# Patient Record
Sex: Male | Born: 2000 | Race: Black or African American | Hispanic: No | Marital: Single
Health system: Southern US, Community
[De-identification: ages and names within clinical notes are randomized; demographics above are authoritative.]

## PROBLEM LIST (undated history)

## (undated) HISTORY — PX: HERNIA REPAIR: SHX51

---
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
## 2018-04-14 ENCOUNTER — Emergency Department: Payer: 59

## 2018-04-14 ENCOUNTER — Emergency Department
Admission: EM | Admit: 2018-04-14 | Discharge: 2018-04-14 | Disposition: A | Discharge status: Home / Self Care | Payer: 59 | Attending: Emergency Medicine | Admitting: Emergency Medicine

## 2018-04-14 ENCOUNTER — Other Ambulatory Visit: Payer: Self-pay

## 2018-04-14 DIAGNOSIS — Z7722 Contact with and (suspected) exposure to environmental tobacco smoke (acute) (chronic): Secondary | ICD-10-CM | POA: Diagnosis not present

## 2018-04-14 DIAGNOSIS — R109 Unspecified abdominal pain: Secondary | ICD-10-CM | POA: Diagnosis not present

## 2018-04-14 DIAGNOSIS — R0789 Other chest pain: Secondary | ICD-10-CM

## 2018-04-14 DIAGNOSIS — R0602 Shortness of breath: Secondary | ICD-10-CM | POA: Diagnosis not present

## 2018-04-14 LAB — CBC
HCT: 42.4 % (ref 40.0–52.0)
HEMOGLOBIN: 14.5 g/dL (ref 13.0–18.0)
MCH: 27.7 pg (ref 26.0–34.0)
MCHC: 34.1 g/dL (ref 32.0–36.0)
MCV: 81 fL (ref 80.0–100.0)
PLATELETS: 234 10*3/uL (ref 150–440)
RBC: 5.23 MIL/uL (ref 4.40–5.90)
RDW: 13.3 % (ref 11.5–14.5)
WBC: 6.2 10*3/uL (ref 3.8–10.6)

## 2018-04-14 LAB — BASIC METABOLIC PANEL
ANION GAP: 9 (ref 5–15)
BUN: 17 mg/dL (ref 6–20)
CHLORIDE: 103 mmol/L (ref 98–111)
CO2: 28 mmol/L (ref 22–32)
Calcium: 10.1 mg/dL (ref 8.9–10.3)
Creatinine, Ser: 1.29 mg/dL — ABNORMAL HIGH (ref 0.61–1.24)
GFR calc non Af Amer: >60 mL/min (ref >60)
Glucose, Bld: 85 mg/dL (ref 70–99)
POTASSIUM: 4.2 mmol/L (ref 3.5–5.1)
SODIUM: 140 mmol/L (ref 135–145)

## 2018-04-14 LAB — TROPONIN I

## 2018-04-14 MED ORDER — FAMOTIDINE 20 MG PO TABS: 20.0000 mg | ORAL_TABLET | Freq: Two times a day (BID) | ORAL | 0 refills | Status: DC

## 2018-04-14 MED ORDER — ALUM & MAG HYDROXIDE-SIMETH 200-200-20 MG/5ML PO SUSP
15.0000 mL | Freq: Once | ORAL | Status: AC
  Administered 2018-04-14: 15 mL via ORAL
  Filled 2018-04-14: qty 30

## 2018-04-14 MED ORDER — FAMOTIDINE 20 MG PO TABS
20.0000 mg | ORAL_TABLET | Freq: Once | ORAL | Status: AC
  Administered 2018-04-14: 20 mg via ORAL
  Filled 2018-04-14: qty 1

## 2018-04-14 NOTE — ED Notes (Signed)
Pt states that he has been having some chest pain and abdominal pain for the last hour. Pt states that it feels more like burning. Pt In no distress at this time.

## 2018-04-14 NOTE — Discharge Instructions (Signed)
Take the Pepcid as prescribed over the next couple of weeks.  Return to the ER for new, worsening, persistent severe chest or abdominal pain, shortness of breath, lightheadedness, fever, vomiting, or any other new or worsening symptoms that concern you.

## 2018-04-14 NOTE — ED Provider Notes (Signed)
[facility] Emergency Department Provider Note ____________________________________________   First MD Initiated Contact with Patient 04/14/18 [phone]     (approximate)  I have reviewed the triage vital signs and the nursing notes.   HISTORY  Chief Complaint Chest Pain    HPI Brandon Armstrong is a 17 y.o. male with PMH of GERD who presents with chest pain, acute onset around 3 AM, described as sharp, and occurring mainly in the left side of his chest.  He states it feels like a bubble that is expanding and contracting.  He denies any radiation to me.  He reports mild shortness of breath but denies nausea, lightheadedness, cough, or fever.  No prior history of this pain, although he states that he does have GERD and it has flared up recently.  He denies any leg swelling.  History reviewed. No pertinent past medical history.  There are no active problems to display for this patient.   Past Surgical History:  Procedure Laterality Date  . HERNIA REPAIR      Prior to Admission medications   Medication Sig Start Date End Date Taking? Authorizing Provider  famotidine (PEPCID) 20 MG tablet Take 1 tablet (20 mg total) by mouth 2 (two) times daily for 15 days. 04/14/18 04/29/18  Dionne Bucy, MD    Allergies Patient has no known allergies.  No family history on file.  Social History Social History   Tobacco Use  . Smoking status: Passive Smoke Exposure - Never Smoker  . Smokeless tobacco: Never Used  Substance Use Topics  . Alcohol use: No  . Drug use: No    Review of Systems  Constitutional: No fever. Eyes: No redness. ENT: No neck pain. Cardiovascular: Positive for chest pain. Respiratory: Positive for mild shortness of breath. Gastrointestinal: No vomiting.  Genitourinary: Negative for flank pain.  Musculoskeletal: Negative for back pain. Skin: Negative for rash. Neurological: Negative for  headache.   ____________________________________________   PHYSICAL EXAM:  VITAL SIGNS: ED Triage Vitals  Enc Vitals Group     BP 04/14/18 0337 117/67     Pulse Rate 04/14/18 0337 (!) 58     Resp 04/14/18 0337 17     Temp 04/14/18 0337 98 F (36.7 C)     Temp Source 04/14/18 0337 Oral     SpO2 04/14/18 0337 100 %     Weight 04/14/18 0335 150 lb (68 kg)     Height 04/14/18 0335 6\' 1"  (1.854 m)     Head Circumference --      Peak Flow --      Pain Score 04/14/18 0335 9     Pain Loc --      Pain Edu? --      Excl. in [place]? --     Constitutional: Alert and oriented. Well appearing and in no acute distress. Eyes: Conjunctivae are normal.  Head: Atraumatic. Nose: No congestion/rhinnorhea. Mouth/Throat: Mucous membranes are moist.   Neck: Normal range of motion.  Cardiovascular: Normal rate, regular rhythm. Grossly normal heart sounds.  Good peripheral circulation. Respiratory: Normal respiratory effort.  No retractions. Lungs CTAB. Gastrointestinal: Soft and nontender. No distention.  Genitourinary: No flank tenderness. Musculoskeletal: No lower extremity edema.  Extremities warm and well perfused.  No calf or popliteal swelling or tenderness. Neurologic:  Normal speech and language. No gross focal neurologic deficits are appreciated.  Skin:  Skin is warm and dry. No rash noted. Psychiatric: Mood and affect are normal. Speech and behavior are normal.  ____________________________________________   LABS (all labs ordered are listed, but only abnormal results are displayed)  Labs Reviewed  BASIC METABOLIC PANEL - Abnormal; Notable for the following components:      Result Value   Creatinine, Ser 1.29 (*)    All other components within normal limits  CBC  TROPONIN I   ____________________________________________  EKG  ED ECG REPORT I, Dionne Bucy, the attending physician, personally viewed and interpreted this ECG.  Date: 04/14/2018 EKG Time: 0336 Rate:  72 Rhythm: normal sinus rhythm QRS Axis: Right axis Intervals: Incomplete RBBB ST/T Wave abnormalities: normal Narrative Interpretation: no evidence of acute ischemia  ____________________________________________  RADIOLOGY  CXR no focal infiltrate or other acute abnormalities  ____________________________________________   PROCEDURES  Procedure(s) performed: No  Procedures  Critical Care performed: No ____________________________________________   INITIAL IMPRESSION / ASSESSMENT AND PLAN / ED COURSE  Pertinent labs & imaging results that were available during my care of the patient were reviewed by me and considered in my medical decision making (see chart for details).  17 year old male with PMH as noted above presents with sharp, atypical left-sided chest pain which awoke him from sleep and is now subsiding.  He reports mild associated shortness of breath and states that the pain is somewhat migratory positional.  He has a history of GERD and although he states that he has not had this pain before he does report that his GERD has been flaring up recently.  He is not on any medication for regularly.  On exam, the patient is very well-appearing and comfortable.  His vital signs are normal.  The remainder of his exam is unremarkable.  His EKG shows incomplete RBBB but is nonischemic.  Overall the presentation is consistent with benign etiology.  I suspect most likely GERD, gastritis, or gas pain.  Since there is somewhat migratory and positional differential also includes musculoskeletal pain.  ----------------------------------------- 6:20 AM on 04/14/2018 -----------------------------------------  The pain has significantly improved after the Pepcid and Maalox.  The patient feels well to go home.  Given his young age normal EKG, and the overall extremely low risk for ACS, there is no indication for repeat troponin.  The fact that the pain improved with the Pepcid and Maalox  is highly suggestive of GERD as the cause.  I will prescribe a two-week course of Pepcid.  Return precautions given, and the patient expresses understanding. ____________________________________________   FINAL CLINICAL IMPRESSION(S) / ED DIAGNOSES  Final diagnoses:  Atypical chest pain      NEW MEDICATIONS STARTED DURING THIS VISIT:  New Prescriptions   FAMOTIDINE (PEPCID) 20 MG TABLET    Take 1 tablet (20 mg total) by mouth 2 (two) times daily for 15 days.     Note:  This document was prepared using Dragon voice recognition software and may include unintentional dictation errors.    Dionne Bucy, MD 04/14/18 [phone]

## 2018-04-14 NOTE — ED Triage Notes (Signed)
Pt arrives to ED via POV from home with c/o left-sided chest pain with abdominal radiation x30 mins. (+) SHOB, but denies N/V/D, no fever. Pt reports pain as "sharp and pressure", no cardiac h/x.

## 2019-09-30 ENCOUNTER — Other Ambulatory Visit: Payer: Self-pay

## 2019-09-30 ENCOUNTER — Emergency Department
Admission: EM | Admit: 2019-09-30 | Discharge: 2019-09-30 | Disposition: A | Discharge status: Home / Self Care | Payer: 59 | Attending: Emergency Medicine | Admitting: Emergency Medicine

## 2019-09-30 ENCOUNTER — Encounter: Payer: Self-pay | Admitting: Emergency Medicine

## 2019-09-30 DIAGNOSIS — R251 Tremor, unspecified: Secondary | ICD-10-CM | POA: Insufficient documentation

## 2019-09-30 DIAGNOSIS — R110 Nausea: Secondary | ICD-10-CM | POA: Insufficient documentation

## 2019-09-30 DIAGNOSIS — Z7722 Contact with and (suspected) exposure to environmental tobacco smoke (acute) (chronic): Secondary | ICD-10-CM | POA: Insufficient documentation

## 2019-09-30 DIAGNOSIS — R197 Diarrhea, unspecified: Secondary | ICD-10-CM | POA: Insufficient documentation

## 2019-09-30 DIAGNOSIS — R252 Cramp and spasm: Secondary | ICD-10-CM | POA: Insufficient documentation

## 2019-09-30 MED ORDER — ONDANSETRON 4 MG PO TBDP
4.0000 mg | ORAL_TABLET | Freq: Once | ORAL | Status: AC
  Administered 2019-09-30: 4 mg via ORAL
  Filled 2019-09-30: qty 1

## 2019-09-30 MED ORDER — DICYCLOMINE HCL 10 MG PO CAPS
10.0000 mg | ORAL_CAPSULE | Freq: Once | ORAL | Status: AC
  Administered 2019-09-30: 10 mg via ORAL
  Filled 2019-09-30: qty 1

## 2019-09-30 MED ORDER — ONDANSETRON 4 MG PO TBDP: 4.0000 mg | ORAL_TABLET | Freq: Three times a day (TID) | ORAL | 0 refills | Status: AC | PRN

## 2019-09-30 MED ORDER — DICYCLOMINE HCL 10 MG PO CAPS: 10.0000 mg | ORAL_CAPSULE | Freq: Four times a day (QID) | ORAL | 0 refills | Status: AC

## 2019-09-30 NOTE — ED Triage Notes (Signed)
Pt presents via pov from home with nausea x 3 weeks. States he ate at [employer] and has felt nauseous ever since. He reports that he has had some diarrhea but no actual emesis. Pt also states that he smoked marijuana daily until a week ago, when he decided to quit; nausea continues. Pt alert & oriented, nad noted.

## 2019-09-30 NOTE — ED Notes (Signed)
See triage note  Presents with complaints of stomach cramping after eating food  States he is able to drink fluids   Denies any vomiting

## 2019-09-30 NOTE — ED Provider Notes (Signed)
Emergency Department Provider Note  ____________________________________________  Time seen: Approximately 9:30 PM  I have reviewed the triage vital signs and the nursing notes.   HISTORY  Chief Complaint Nausea   Historian Patient   HPI Brandon Armstrong is a 19 y.o. male presents to the emergency department with nausea abdominal spasms since stopping marijuana approximately 4 days ago.  Patient states that he is also had some diarrhea.  He states that his hands have been shaking since stopping marijuana.  No chest pain, chest tightness or abdominal pain.  He denies fever or chills at home.  No associated rhinorrhea, nasal congestion or nonproductive cough.  No other alleviating measures have been attempted.   History reviewed. No pertinent past medical history.   Immunizations up to date:  Yes.     History reviewed. No pertinent past medical history.  There are no problems to display for this patient.   Past Surgical History:  Procedure Laterality Date  . HERNIA REPAIR      Prior to Admission medications   Medication Sig Start Date End Date Taking? Authorizing Provider  dicyclomine (BENTYL) 10 MG capsule Take 1 capsule (10 mg total) by mouth 4 (four) times daily for 14 days. 09/30/19 10/14/19  Orvil Feil, PA-C  ondansetron (ZOFRAN ODT) 4 MG disintegrating tablet Take 1 tablet (4 mg total) by mouth every 8 (eight) hours as needed for up to 5 days for nausea or vomiting. 09/30/19 10/05/19  Orvil Feil, PA-C    Allergies Patient has no known allergies.  History reviewed. No pertinent family history.  Social History Social History   Tobacco Use  . Smoking status: Passive Smoke Exposure - Never Smoker  . Smokeless tobacco: Never Used  Substance Use Topics  . Alcohol use: No  . Drug use: No     Review of Systems  Constitutional: No fever/chills Eyes:  No discharge ENT: No upper respiratory complaints. Respiratory: no cough. No SOB/ use of accessory  muscles to breath Gastrointestinal: Patient has nausea. No diarrhea.  No constipation. Musculoskeletal: Negative for musculoskeletal pain. Skin: Negative for rash, abrasions, lacerations, ecchymosis.    ____________________________________________   PHYSICAL EXAM:  VITAL SIGNS: ED Triage Vitals  Enc Vitals Group     BP 09/30/19 1601 134/82     Pulse Rate 09/30/19 1601 (!) 123     Resp 09/30/19 1601 20     Temp 09/30/19 1601 98.2 F (36.8 C)     Temp Source 09/30/19 1601 Oral     SpO2 09/30/19 1601 100 %     Weight 09/30/19 1602 155 lb (70.3 kg)     Height 09/30/19 1602 6\' 1"  (1.854 m)     Head Circumference --      Peak Flow --      Pain Score 09/30/19 1601 0     Pain Loc --      Pain Edu? --      Excl. in GC? --      Constitutional: Alert and oriented. Well appearing and in no acute distress. Eyes: Conjunctivae are normal. PERRL. EOMI. Head: Atraumatic. Cardiovascular: Normal rate, regular rhythm. Normal S1 and S2.  Good peripheral circulation. Respiratory: Normal respiratory effort without tachypnea or retractions. Lungs CTAB. Good air entry to the bases with no decreased or absent breath sounds Gastrointestinal: Bowel sounds x 4 quadrants. Soft and nontender to palpation. No guarding or rigidity. No distention. Musculoskeletal: Full range of motion to all extremities. No obvious deformities noted Neurologic:  Normal for  age. No gross focal neurologic deficits are appreciated.  Skin:  Skin is warm, dry and intact. No rash noted. Psychiatric: Mood and affect are normal for age. Speech and behavior are normal.   ____________________________________________   LABS (all labs ordered are listed, but only abnormal results are displayed)  Labs Reviewed - No data to display ____________________________________________  EKG   ____________________________________________  RADIOLOGY   No results  found.  ____________________________________________    PROCEDURES  Procedure(s) performed:     Procedures     Medications  ondansetron (ZOFRAN-ODT) disintegrating tablet 4 mg (4 mg Oral Given 09/30/19 1657)  dicyclomine (BENTYL) capsule 10 mg (10 mg Oral Given 09/30/19 1657)     ____________________________________________   INITIAL IMPRESSION / ASSESSMENT AND PLAN / ED COURSE  Pertinent labs & imaging results that were available during my care of the patient were reviewed by me and considered in my medical decision making (see chart for details).    Assessment and plan Nausea 19 year old male presents to the emergency department with shaking hands, nausea and a sensation of abdominal spasms after stopping marijuana for the past 4 days.  Vital signs were reassuring at triage.  On physical exam, patient was able to provide assisting to history.  His abdomen was soft and nontender without guarding.  Patient was given Zofran and Bentyl in the emergency department he reported that his symptoms improved significantly.  He was discharged with Zofran and Bentyl.  Return precautions were given to return with new or worsening symptoms.  All patient questions were answered.   ____________________________________________  FINAL CLINICAL IMPRESSION(S) / ED DIAGNOSES  Final diagnoses:  Nausea      NEW MEDICATIONS STARTED DURING THIS VISIT:  ED Discharge Orders         Ordered    ondansetron (ZOFRAN ODT) 4 MG disintegrating tablet  Every 8 hours PRN     09/30/19 1839    dicyclomine (BENTYL) 10 MG capsule  4 times daily     09/30/19 1839              This chart was dictated using voice recognition software/Dragon. Despite best efforts to proofread, errors can occur which can change the meaning. Any change was purely unintentional.     Lannie Fields, PA-C 09/30/19 2134    Arta Silence, MD 09/30/19 2324

## 2019-09-30 NOTE — ED Triage Notes (Signed)
First RN Note: Pt presents to ED via POV with c/o nausea. Pt denies vomiting, states he just feels like he is going to throw up.

## 2019-11-18 IMAGING — CR DG CHEST 2V
3 series · 3 of 3 positions shown · non-contrast
Comparison: Chest radiograph February 09, 2013

CLINICAL DATA: LEFT chest pain radiating to the abdomen for 30
minutes. Shortness of breath.

EXAM:
CHEST - 2 VIEW

[chest pa]
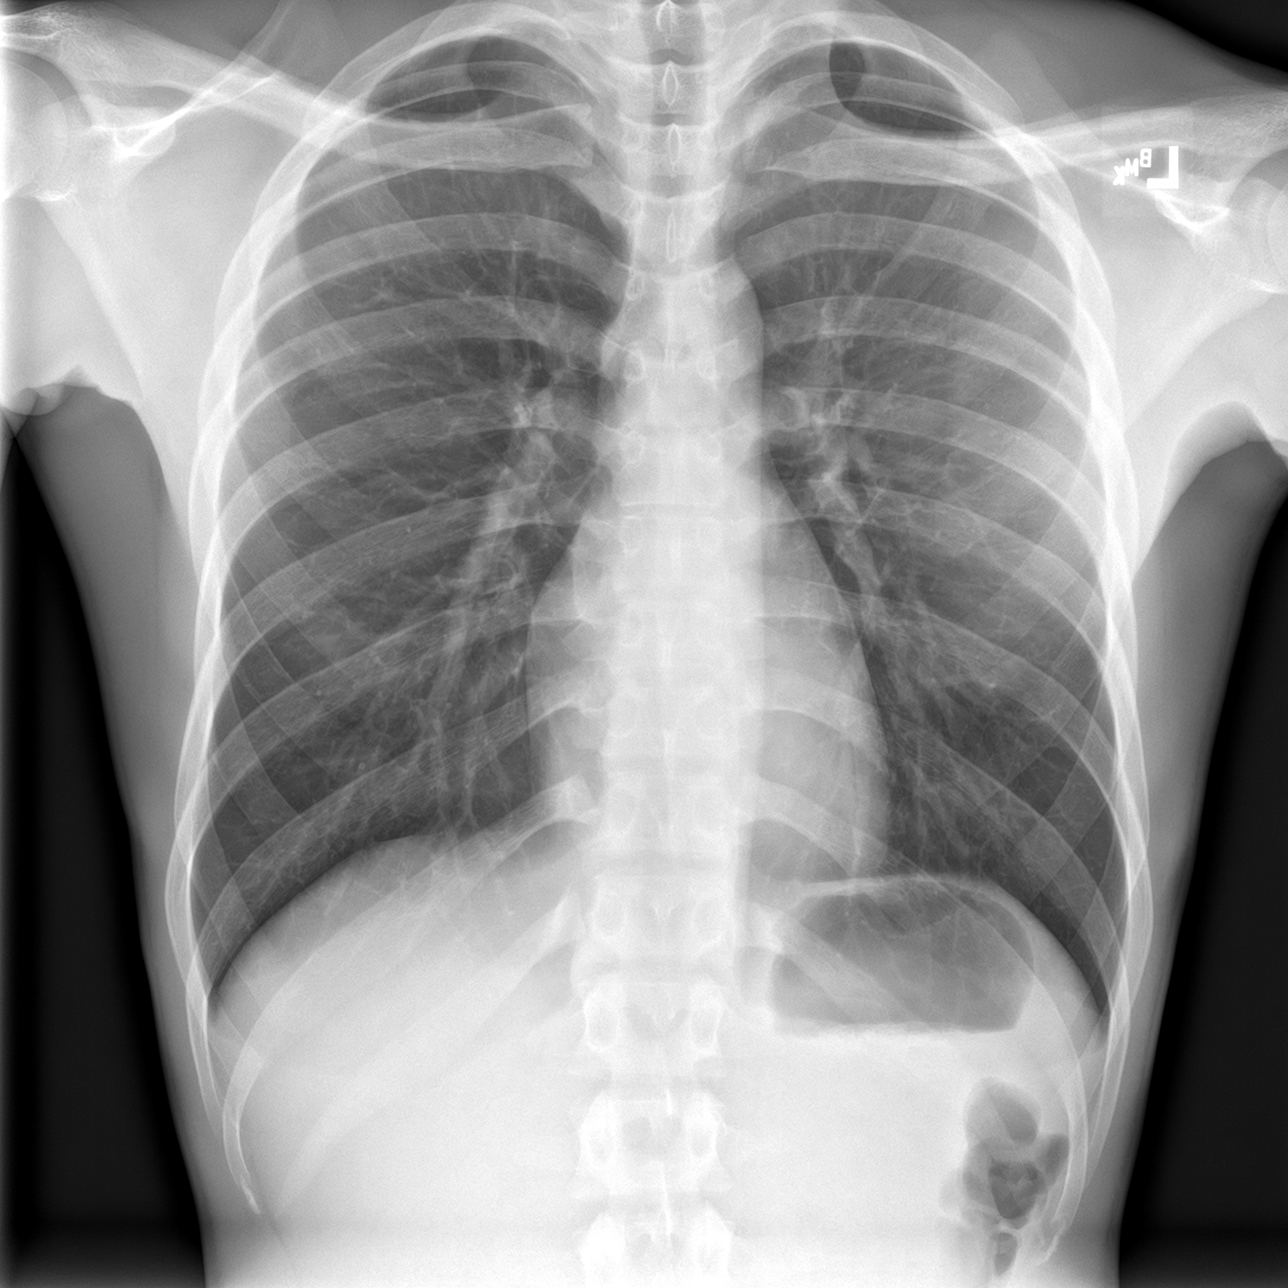

[chest lat (1 of 2)]
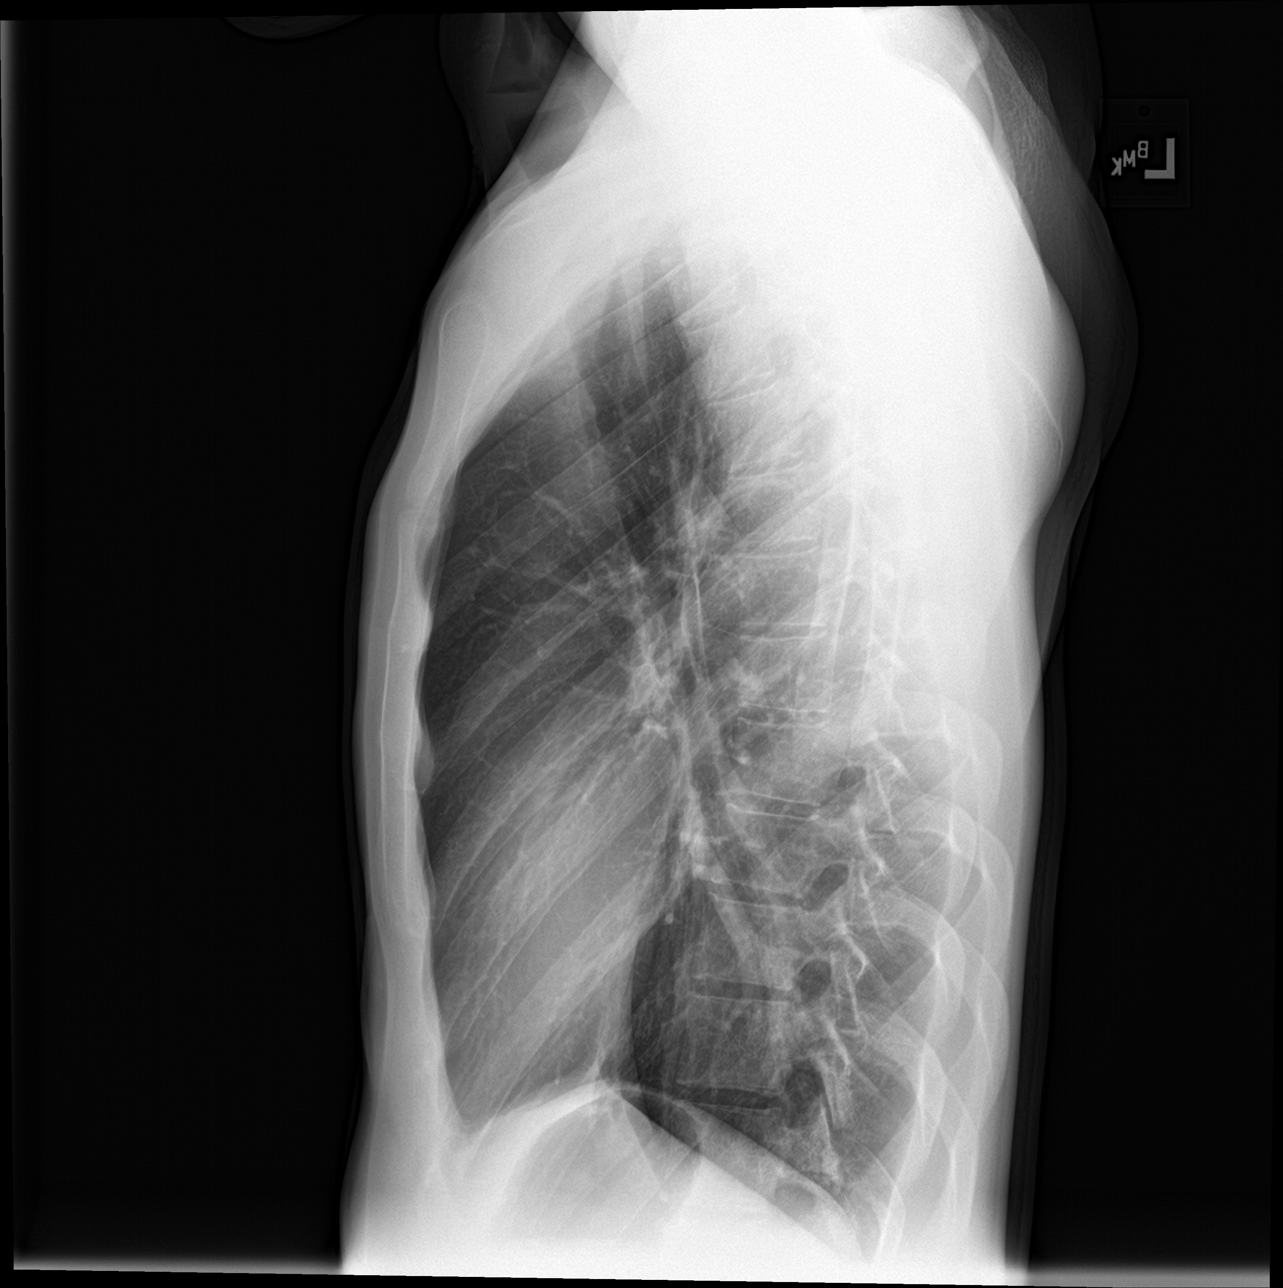

[chest lat (2 of 2)]
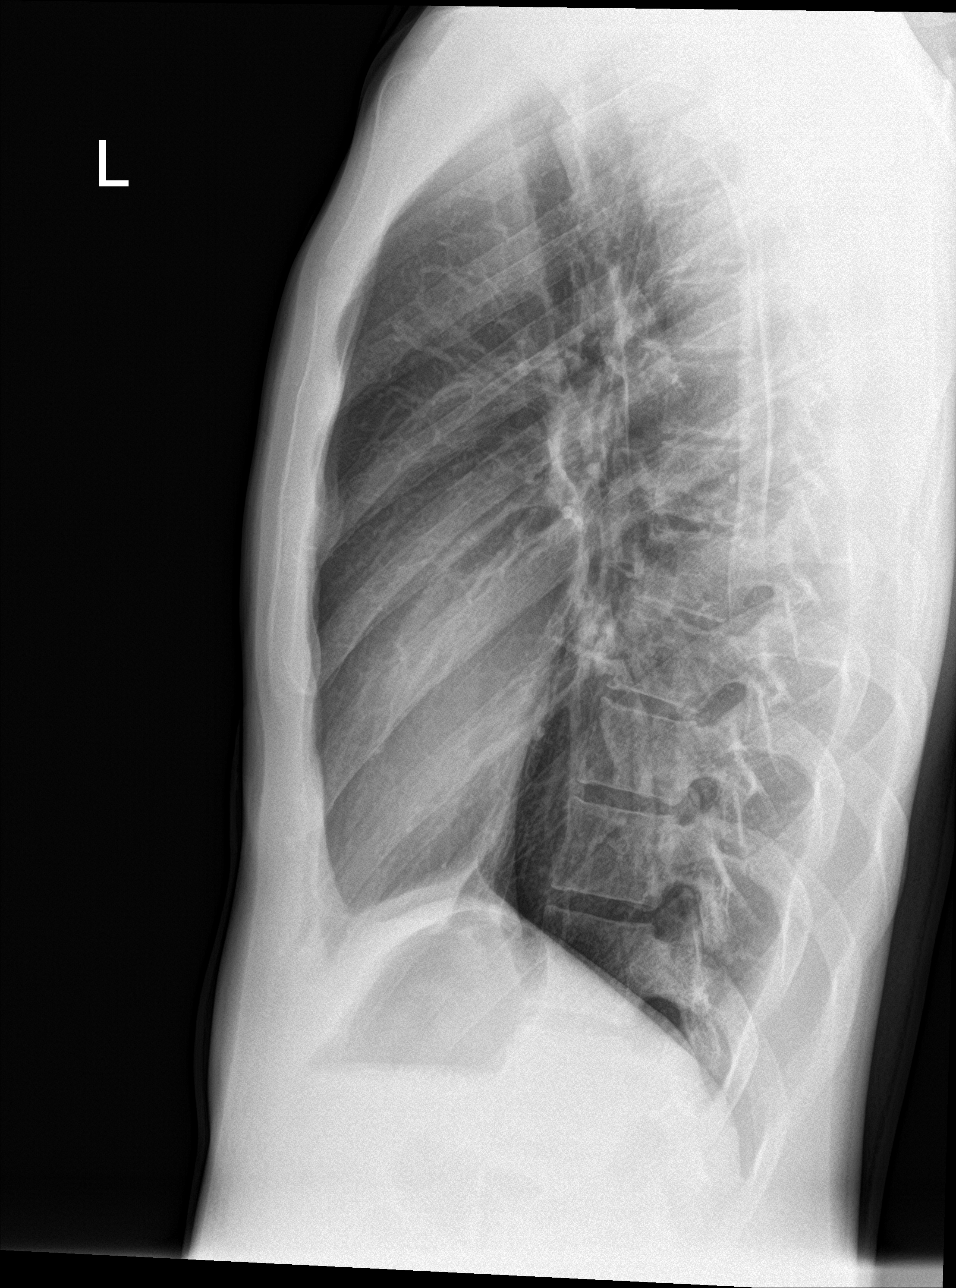

[3 of 3 positions shown; findings below may reference images not displayed]

FINDINGS: The heart size and mediastinal contours are within normal limits.
Both lungs are clear. The visualized skeletal structures are
unremarkable.
IMPRESSION: Normal chest.

## 2021-08-16 DIAGNOSIS — Z6821 Body mass index (BMI) 21.0-21.9, adult: Secondary | ICD-10-CM | POA: Diagnosis not present

## 2021-08-16 DIAGNOSIS — B002 Herpesviral gingivostomatitis and pharyngotonsillitis: Secondary | ICD-10-CM | POA: Diagnosis not present

## 2021-08-16 DIAGNOSIS — K120 Recurrent oral aphthae: Secondary | ICD-10-CM | POA: Diagnosis not present

## 2021-08-16 DIAGNOSIS — Z1331 Encounter for screening for depression: Secondary | ICD-10-CM | POA: Diagnosis not present

## 2022-08-14 DIAGNOSIS — R0789 Other chest pain: Secondary | ICD-10-CM | POA: Diagnosis not present

## 2022-08-14 DIAGNOSIS — R079 Chest pain, unspecified: Secondary | ICD-10-CM | POA: Diagnosis not present

## 2022-08-14 NOTE — ED Triage Notes (Signed)
EMS brings pt in from home for c/o onset CP, hyperventilating upon their arrival

## 2022-08-15 ENCOUNTER — Emergency Department: Payer: BC Managed Care – PPO

## 2022-08-15 ENCOUNTER — Other Ambulatory Visit: Payer: Self-pay

## 2022-08-15 ENCOUNTER — Emergency Department
Admission: EM | Admit: 2022-08-15 | Discharge: 2022-08-15 | Disposition: A | Discharge status: Home / Self Care | Payer: BC Managed Care – PPO | Attending: Emergency Medicine | Admitting: Emergency Medicine

## 2022-08-15 ENCOUNTER — Encounter: Payer: Self-pay | Admitting: Emergency Medicine

## 2022-08-15 DIAGNOSIS — R079 Chest pain, unspecified: Secondary | ICD-10-CM

## 2022-08-15 LAB — BASIC METABOLIC PANEL
Anion gap: 8 (ref 5–15)
BUN: 12 mg/dL (ref 6–20)
CO2: 26 mmol/L (ref 22–32)
Calcium: 9.4 mg/dL (ref 8.9–10.3)
Chloride: 104 mmol/L (ref 98–111)
Creatinine, Ser: 0.94 mg/dL (ref 0.61–1.24)
GFR, Estimated: >60 mL/min (ref >60)
Glucose, Bld: 106 mg/dL — ABNORMAL HIGH (ref 70–99)
Potassium: 3.4 mmol/L — ABNORMAL LOW (ref 3.5–5.1)
Sodium: 138 mmol/L (ref 135–145)

## 2022-08-15 LAB — CBC
HCT: 39.8 % (ref 39.0–52.0)
Hemoglobin: 13.1 g/dL (ref 13.0–17.0)
MCH: 26.4 pg (ref 26.0–34.0)
MCHC: 32.9 g/dL (ref 30.0–36.0)
MCV: 80.1 fL (ref 80.0–100.0)
Platelets: 229 10*3/uL (ref 150–400)
RBC: 4.97 MIL/uL (ref 4.22–5.81)
RDW: 12.8 % (ref 11.5–15.5)
WBC: 7.1 10*3/uL (ref 4.0–10.5)
nRBC: 0 % (ref 0.0–0.2)

## 2022-08-15 LAB — TROPONIN I (HIGH SENSITIVITY)
Troponin I (High Sensitivity): 3 ng/L (ref <18)
Troponin I (High Sensitivity): <2 ng/L (ref <18)

## 2022-08-15 MED ORDER — NAPROXEN 500 MG PO TABS: 500.0000 mg | ORAL_TABLET | Freq: Two times a day (BID) | ORAL | 0 refills | Status: AC

## 2022-08-15 MED ORDER — KETOROLAC TROMETHAMINE 60 MG/2ML IM SOLN
30.0000 mg | Freq: Once | INTRAMUSCULAR | Status: AC
  Administered 2022-08-15: 30 mg via INTRAMUSCULAR
  Filled 2022-08-15: qty 2

## 2022-08-15 NOTE — ED Triage Notes (Signed)
Pt arrived via [facility] from states about 1 hour prior to arrival pt started having chest pain, pt states he was smoking his vape at the time.  Pt calm at this time no hyperventilating at this time.

## 2022-08-15 NOTE — ED Provider Notes (Signed)
[facility] Provider Note    Event Date/Time   First MD Initiated Contact with Patient 08/15/22 [phone]     (approximate)   History   Chest Pain   HPI  History obtained via patient and mother  Brandon Armstrong is a 22 y.o. male brought to the ED via EMS from home with a chief complaint of chest pain.  Patient reports central chest pain which began approximately 1 hour prior to arrival when he was smoking his vape.  Denies recent fever, cough, shortness of breath, abdominal pain, nausea, vomiting, palpitations or dizziness.  Symptoms have nearly resolved.     Past Medical History  History reviewed. No pertinent past medical history.   Active Problem List  There are no problems to display for this patient.    Past Surgical History   Past Surgical History:  Procedure Laterality Date   HERNIA REPAIR       Home Medications   Prior to Admission medications   Medication Sig Start Date End Date Taking? Authorizing Provider  dicyclomine (BENTYL) 10 MG capsule Take 1 capsule (10 mg total) by mouth 4 (four) times daily for 14 days. 09/30/19 10/14/19  Lannie Fields, PA-C     Allergies  Patient has no known allergies.   Family History  History reviewed. No pertinent family history.   Physical Exam  Triage Vital Signs: ED Triage Vitals  Enc Vitals Group     BP 08/15/22 0004 105/82     Pulse Rate 08/15/22 0004 87     Resp 08/15/22 0004 18     Temp 08/15/22 0004 98.7 F (37.1 C)     Temp Source 08/15/22 0004 Oral     SpO2 08/14/22 2343 95 %     Weight 08/15/22 0005 160 lb (72.6 kg)     Height 08/15/22 0005 6\' 1"  (1.854 m)     Head Circumference --      Peak Flow --      Pain Score 08/15/22 0004 8     Pain Loc --      Pain Edu? --      Excl. in [place]? --     Updated Vital Signs: BP 121/75 (BP Location: Right Arm)   Pulse 65   Temp 98.6 F (37 C) (Oral)   Resp 17   Ht 6\' 1"  (1.854 m)   Wt 72.6 kg   SpO2 100%   BMI 21.11 kg/m     General: Sleep, awakened for exam.  No distress.  CV:  RRR.  Good peripheral perfusion.  Resp:  Normal effort.  CTAB. Abd:  Nontender.  No distention.  Other:     ED Results / Procedures / Treatments  Labs (all labs ordered are listed, but only abnormal results are displayed) Labs Reviewed  BASIC METABOLIC PANEL - Abnormal; Notable for the following components:      Result Value   Potassium 3.4 (*)    Glucose, Bld 106 (*)    All other components within normal limits  CBC  TROPONIN I (HIGH SENSITIVITY)  TROPONIN I (HIGH SENSITIVITY)     EKG  ED ECG REPORT I, Angle Karel J, the attending physician, personally viewed and interpreted this ECG.   Date: 08/15/2022  EKG Time: 0008  Rate: 67  Rhythm: normal sinus rhythm  Axis: Normal  Intervals:none  ST&T Change: Nonspecific    RADIOLOGY I have independently visualized and interpreted patient's x-ray as well as noted the radiology interpretation:  Chest  x-ray: No acute cardiopulmonary process  Official radiology report(s): DG Chest Port 1 View  Result Date: 08/15/2022 CLINICAL DATA:  Chest pain EXAM: PORTABLE CHEST 1 VIEW COMPARISON:  04/14/2018 FINDINGS: The heart size and mediastinal contours are within normal limits. Both lungs are clear. The visualized skeletal structures are unremarkable. IMPRESSION: No active disease. Electronically Signed   By: Inez Catalina M.D.   On: 08/15/2022 00:35     PROCEDURES:  Critical Care performed: No  Procedures   MEDICATIONS ORDERED IN ED: Medications  ketorolac (TORADOL) injection 30 mg (has no administration in time range)     IMPRESSION / MDM / ASSESSMENT AND PLAN / ED COURSE  I reviewed the triage vital signs and the nursing notes.                             22 year old male presenting with chest pain while vaping. Differential diagnosis includes, but is not limited to, ACS, aortic dissection, pulmonary embolism, cardiac tamponade, pneumothorax, pneumonia,  pericarditis, myocarditis, GI-related causes including esophagitis/gastritis, and musculoskeletal chest wall pain.   I have personally reviewed patient's records and note her last urgent care visit on 09/2020 for viral symptoms.  Patient's presentation is most consistent with acute presentation with potential threat to life or bodily function.  Laboratory results demonstrate normal WBC 7.1, normal electrolytes, 2 sets of negative troponins.  Chest x-ray and EKG unremarkable.  Will administer IM ketorolac and patient will follow-up with his PCP.  Strict return precautions given.  Patient and mother verbalized understanding agree with plan of care.      FINAL CLINICAL IMPRESSION(S) / ED DIAGNOSES   Final diagnoses:  Nonspecific chest pain     Rx / DC Orders   ED Discharge Orders     None        Note:  This document was prepared using Dragon voice recognition software and may include unintentional dictation errors.   Paulette Blanch, MD 08/15/22 [phone]

## 2022-08-15 NOTE — Discharge Instructions (Addendum)
You may take Naprosyn as needed for pain.  Apply moist heat to affected area several times daily.  Return to the ER for worsening symptoms, persistent vomiting, difficulty breathing or other concerns.

## 2023-07-18 DIAGNOSIS — K529 Noninfective gastroenteritis and colitis, unspecified: Secondary | ICD-10-CM | POA: Diagnosis not present

## 2024-06-02 DIAGNOSIS — U071 COVID-19: Secondary | ICD-10-CM | POA: Diagnosis not present

## 2024-06-02 DIAGNOSIS — R059 Cough, unspecified: Secondary | ICD-10-CM | POA: Diagnosis not present
